# Patient Record
Sex: Female | Born: 2000 | Race: White | Hispanic: No | Marital: Single | Smoking: Never smoker
Health system: Southern US, Community
[De-identification: ages and names within clinical notes are randomized; demographics above are authoritative.]

## PROBLEM LIST (undated history)

## (undated) DIAGNOSIS — R112 Nausea with vomiting, unspecified: Secondary | ICD-10-CM

## (undated) DIAGNOSIS — R040 Epistaxis: Secondary | ICD-10-CM

## (undated) DIAGNOSIS — N920 Excessive and frequent menstruation with regular cycle: Secondary | ICD-10-CM

## (undated) HISTORY — DX: Excessive and frequent menstruation with regular cycle: N92.0

## (undated) HISTORY — DX: Nausea with vomiting, unspecified: R11.2

## (undated) HISTORY — DX: Epistaxis: R04.0

---
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
## 2022-04-01 ENCOUNTER — Ambulatory Visit: Payer: Medicaid Other | Admitting: Adult Health

## 2022-05-16 ENCOUNTER — Encounter: Payer: Self-pay | Admitting: Family Medicine

## 2023-02-05 IMAGING — CT CT ANGIO CHEST
2 of 6 series · 18 of 46 positions shown · IV contrast (Omnipaque or Isovue)
Comparison: None.

CLINICAL DATA: COVID positive with tachycardia.

EXAM:
CT ANGIOGRAPHY CHEST WITH CONTRAST
TECHNIQUE: Multidetector CT imaging of the chest was performed using the
standard protocol during bolus administration of intravenous
contrast. Multiplanar CT image reconstructions and MIPs were
obtained to evaluate the vascular anatomy.
CONTRAST:  25mL OMNIPAQUE IOHEXOL 350 MG/ML SOLN, 25mL OMNIPAQUE
IOHEXOL 350 MG/ML SOLN, 25mL OMNIPAQUE IOHEXOL 350 MG/ML SOLN

[Series 5: pe axial thins · axial · 0.69mm/px · z∈[+994,+1224]mm · 15 of 315 slices shown]
[im 14/315  lung]
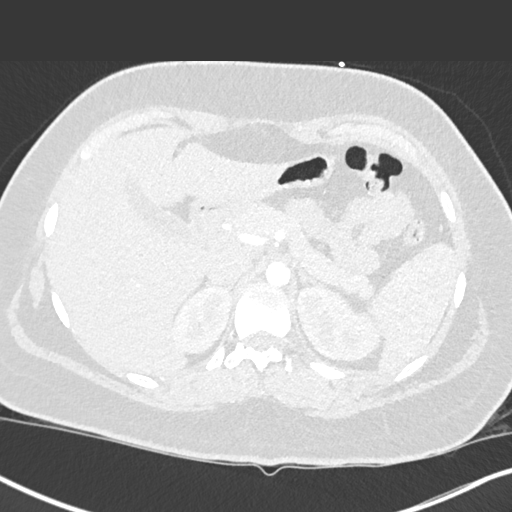
[im 40/315  soft-tissue]
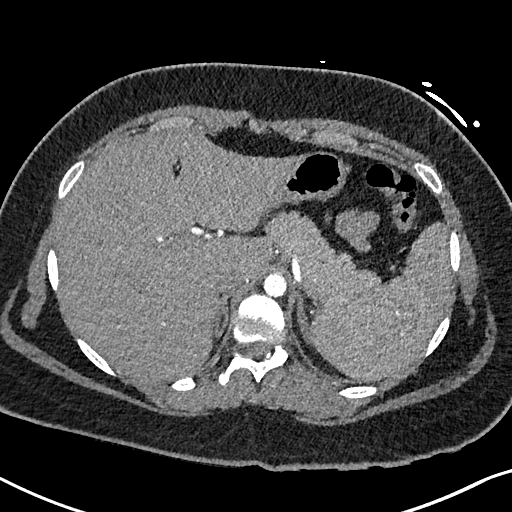
[im 53/315  lung]
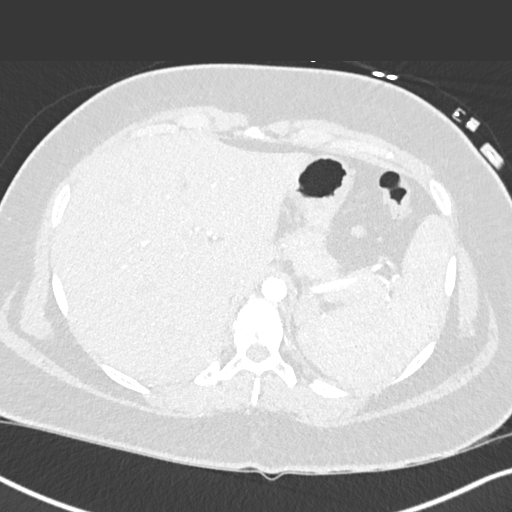
[im 79/315  soft-tissue]
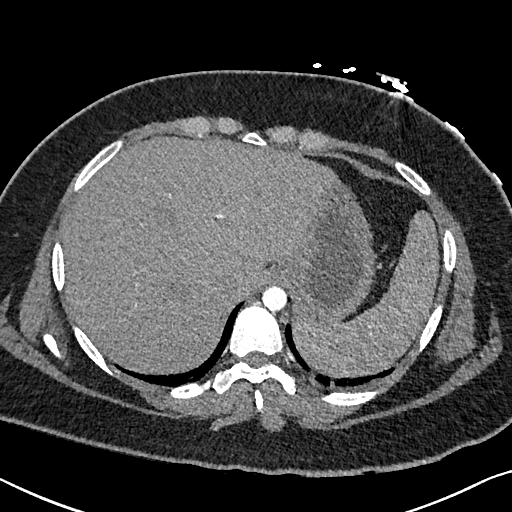
[im 92/315  lung]
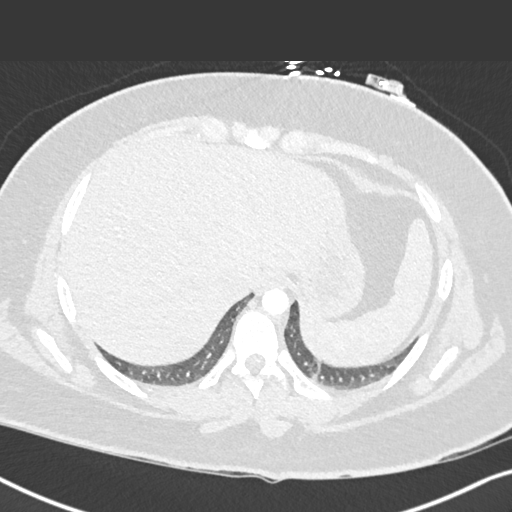
[im 118/315  soft-tissue]
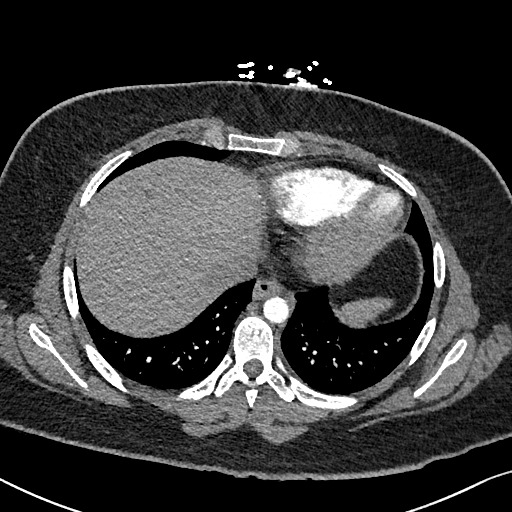
[im 131/315  lung]
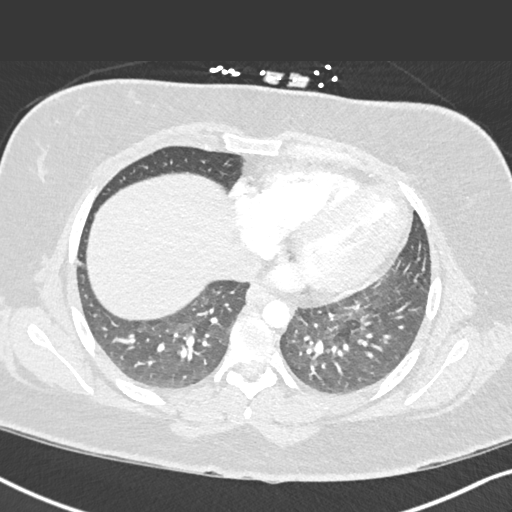
[im 158/315  soft-tissue]
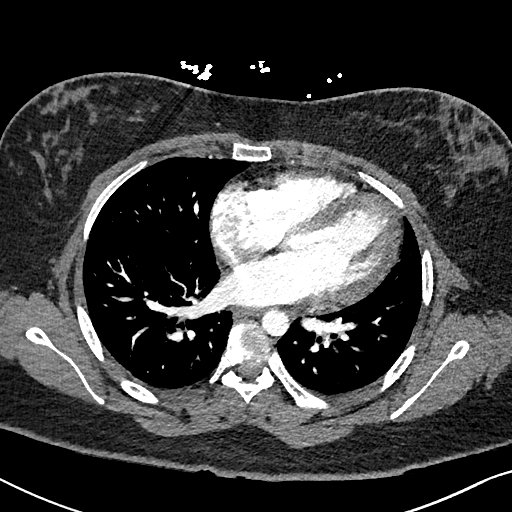
[im 184/315  lung]
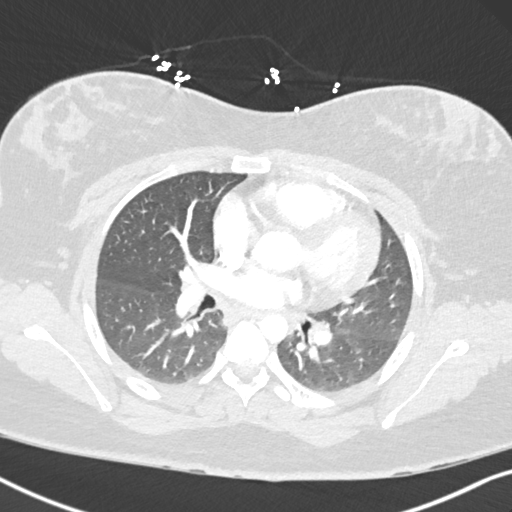
[im 197/315  soft-tissue]
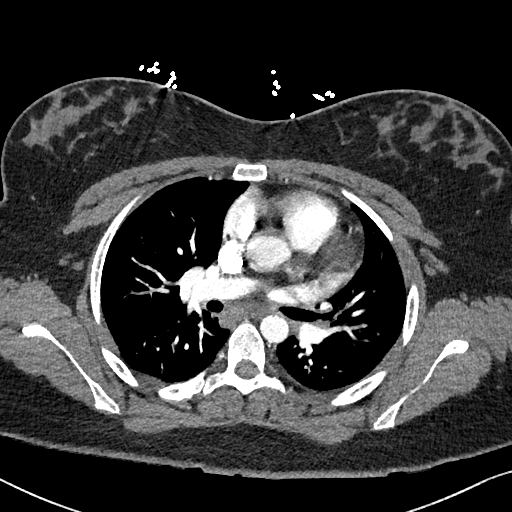
[im 223/315  lung]
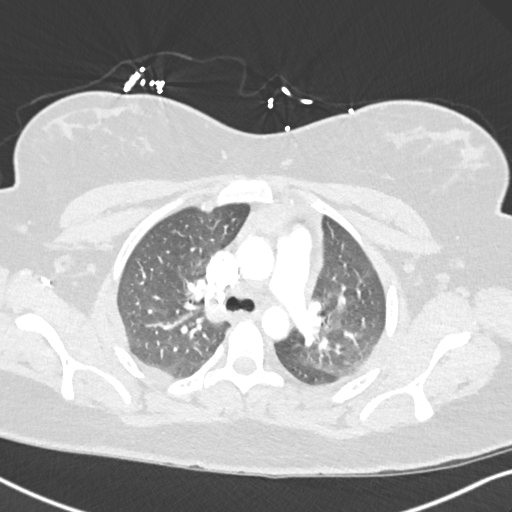
[im 236/315  soft-tissue]
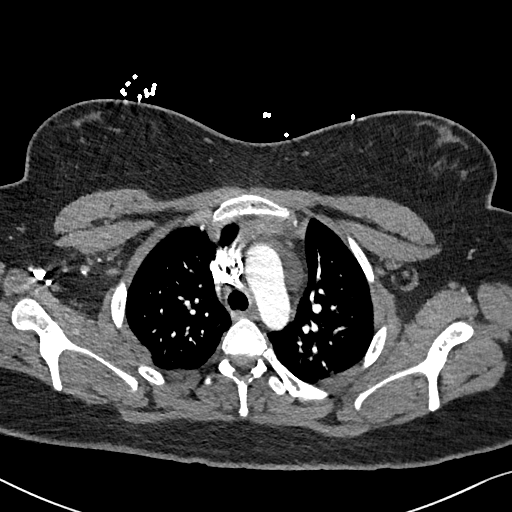
[im 262/315  lung]
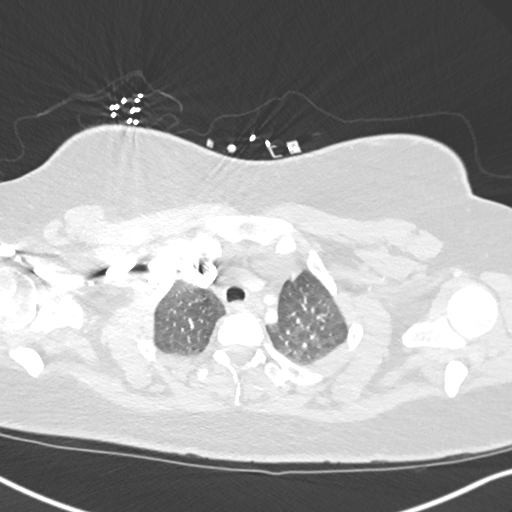
[im 275/315  soft-tissue]
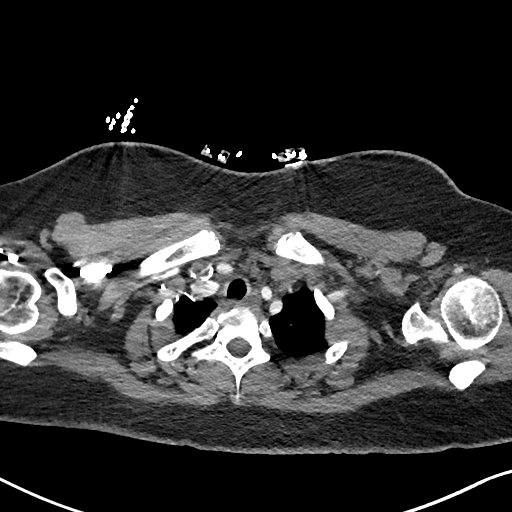
[im 301/315  lung]
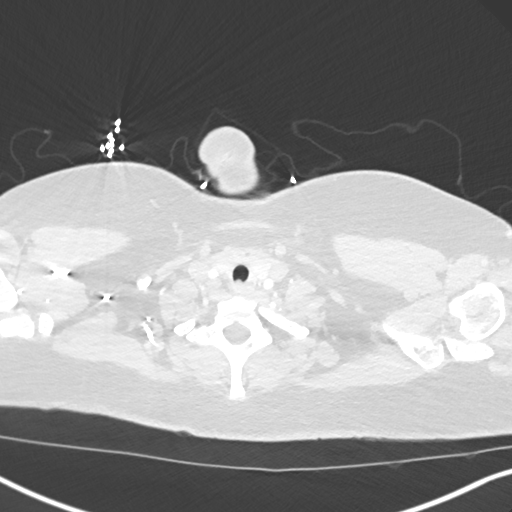

[Series 7: cor soft · coronal · 0.51mm/px · 3 of 154 slices shown]
[im 39/154  soft-tissue]
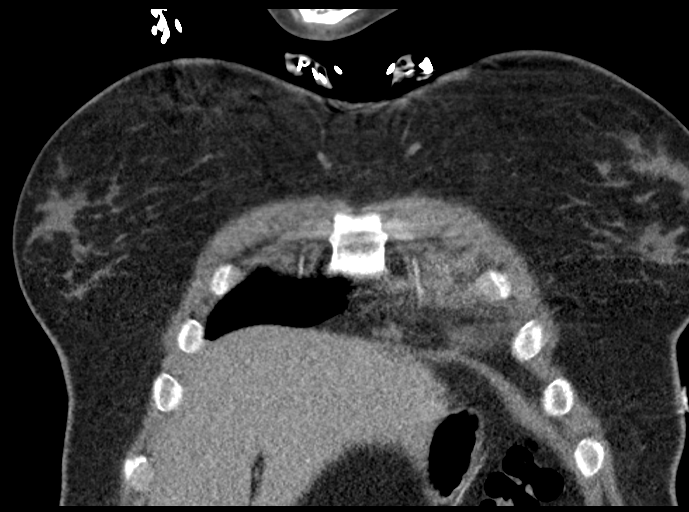
[im 77/154  soft-tissue]
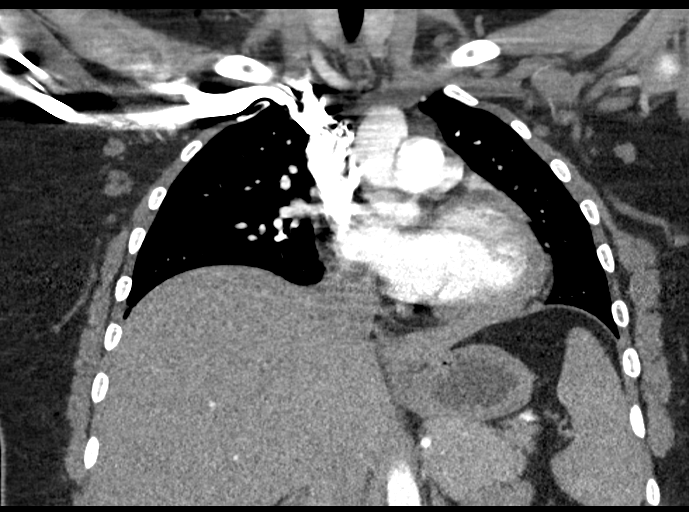
[im 115/154  soft-tissue]
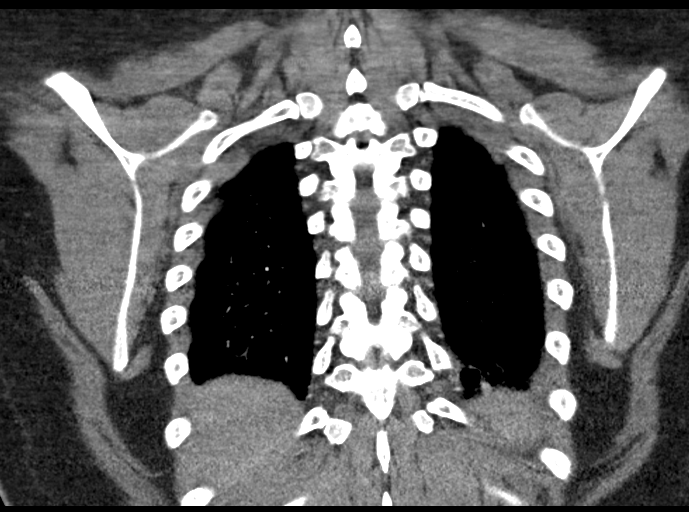

[18 of 46 positions shown; findings below may reference images not displayed]

FINDINGS: Cardiovascular: Satisfactory opacification of the pulmonary arteries
to the segmental level. No evidence of pulmonary embolism. Normal
heart size. No pericardial effusion.

Mediastinum/Nodes: No enlarged mediastinal, hilar, or axillary lymph
nodes. Thyroid gland, trachea, and esophagus demonstrate no
significant findings.

Lungs/Pleura: Lungs are clear. No pleural effusion or pneumothorax.

Upper Abdomen: No acute abnormality.

Musculoskeletal: No chest wall abnormality. No acute or significant
osseous findings.

Review of the MIP images confirms the above findings.
IMPRESSION: No evidence of pulmonary embolism or acute cardiopulmonary disease.

## 2023-04-09 ENCOUNTER — Ambulatory Visit: Payer: Medicaid Other | Admitting: Advanced Practice Midwife

## 2023-05-07 ENCOUNTER — Ambulatory Visit: Payer: Medicaid Other | Admitting: Advanced Practice Midwife

## 2023-05-27 ENCOUNTER — Emergency Department (HOSPITAL_BASED_OUTPATIENT_CLINIC_OR_DEPARTMENT_OTHER): Payer: Medicaid Other

## 2023-05-27 ENCOUNTER — Emergency Department (HOSPITAL_BASED_OUTPATIENT_CLINIC_OR_DEPARTMENT_OTHER)
Admission: EM | Admit: 2023-05-27 | Discharge: 2023-05-28 | Disposition: A | Discharge status: Home / Self Care | Payer: Medicaid Other | Attending: Emergency Medicine | Admitting: Emergency Medicine

## 2023-05-27 ENCOUNTER — Encounter (HOSPITAL_BASED_OUTPATIENT_CLINIC_OR_DEPARTMENT_OTHER): Payer: Self-pay

## 2023-05-27 ENCOUNTER — Other Ambulatory Visit: Payer: Self-pay

## 2023-05-27 DIAGNOSIS — E876 Hypokalemia: Secondary | ICD-10-CM | POA: Diagnosis not present

## 2023-05-27 DIAGNOSIS — R079 Chest pain, unspecified: Secondary | ICD-10-CM | POA: Diagnosis not present

## 2023-05-27 DIAGNOSIS — R7309 Other abnormal glucose: Secondary | ICD-10-CM | POA: Diagnosis not present

## 2023-05-27 LAB — BASIC METABOLIC PANEL
Anion gap: 12 (ref 5–15)
BUN: 10 mg/dL (ref 6–20)
CO2: 22 mmol/L (ref 22–32)
Calcium: 9.7 mg/dL (ref 8.9–10.3)
Chloride: 109 mmol/L (ref 98–111)
Creatinine, Ser: 0.8 mg/dL (ref 0.44–1.00)
GFR, Estimated: >60 mL/min (ref >60)
Glucose, Bld: 118 mg/dL — ABNORMAL HIGH (ref 70–99)
Potassium: 3.4 mmol/L — ABNORMAL LOW (ref 3.5–5.1)
Sodium: 143 mmol/L (ref 135–145)

## 2023-05-27 LAB — TROPONIN I (HIGH SENSITIVITY): Troponin I (High Sensitivity): <2 ng/L (ref <18)

## 2023-05-27 LAB — PREGNANCY, URINE: Preg Test, Ur: NEGATIVE

## 2023-05-27 LAB — CBC
HCT: 41.1 % (ref 36.0–46.0)
Hemoglobin: 14.3 g/dL (ref 12.0–15.0)
MCH: 29.2 pg (ref 26.0–34.0)
MCHC: 34.8 g/dL (ref 30.0–36.0)
MCV: 84 fL (ref 80.0–100.0)
Platelets: 373 10*3/uL (ref 150–400)
RBC: 4.89 MIL/uL (ref 3.87–5.11)
RDW: 12.6 % (ref 11.5–15.5)
WBC: 11.5 10*3/uL — ABNORMAL HIGH (ref 4.0–10.5)
nRBC: 0 % (ref 0.0–0.2)

## 2023-05-27 MED ORDER — POTASSIUM CHLORIDE CRYS ER 20 MEQ PO TBCR
40.0000 meq | EXTENDED_RELEASE_TABLET | Freq: Once | ORAL | Status: AC
  Administered 2023-05-28: 40 meq via ORAL
  Filled 2023-05-27: qty 2

## 2023-05-27 MED ORDER — ONDANSETRON 4 MG PO TBDP
8.0000 mg | ORAL_TABLET | Freq: Once | ORAL | Status: AC
  Administered 2023-05-28: 8 mg via ORAL
  Filled 2023-05-27: qty 2

## 2023-05-27 MED ORDER — ALUM & MAG HYDROXIDE-SIMETH 200-200-20 MG/5ML PO SUSP
30.0000 mL | Freq: Once | ORAL | Status: AC
  Administered 2023-05-28: 30 mL via ORAL
  Filled 2023-05-27: qty 30

## 2023-05-27 NOTE — ED Triage Notes (Signed)
Pt arrived from home via POV c/o sharp chest pain that began @1530  4/10 on pain scale. Pt states for the past 2 days that she has been n/v x 2 days and diarrhea x 7 days 3-4 times per day since.. Pt states that she has been dizzy with positional changes.

## 2023-05-27 NOTE — ED Provider Notes (Signed)
[place] EMERGENCY DEPARTMENT AT [facility] Provider Note   CSN: [id number] Arrival date & time: 05/27/23  1903     History {Add pertinent medical, surgical, social history, OB history to HPI:1} Chief Complaint  Patient presents with   Chest Pain    Pamela Myers is a 22 y.o. female.  The history is provided by the patient.  Chest Pain She has no significant past history and comes in complaining of chest pain since about 3 PM.  She describes a dull, pinching feeling in her midsternal area occasionally radiating to the right parasternal area.  There has been some dyspnea, nausea, vomiting but no diaphoresis.  Symptoms are slightly worse with walking.  Pain has waxed and waned through the day and is actually somewhat less now, but still present.  She has not taken anything for pain.  She is a non-smoker and denies history of hypertension or diabetes or hyperlipidemia.  She does have history of having driven to [place] and back within the last month.  There is a strong family history of premature coronary atherosclerosis.   Home Medications Prior to Admission medications   Medication Sig Start Date End Date Taking? Authorizing Provider  acetaminophen (TYLENOL) 325 MG tablet Take 650 mg by mouth every 6 (six) hours as needed for mild pain, moderate pain or fever.    [provider]  ibuprofen (ADVIL) 200 MG tablet Take 200 mg by mouth every 6 (six) hours as needed for fever, headache or mild pain.    [provider]  Levonorgestrel-Ethinyl Estradiol (AMETHIA) 0.15-0.03 &0.01 MG tablet Take 1 tablet by mouth daily. 12/30/21   Adline Potter, NP  promethazine (PHENERGAN) 25 MG tablet Take 1 tablet (25 mg total) by mouth every 6 (six) hours as needed for nausea or vomiting. 12/30/21   Adline Potter, NP      Allergies    Azithromycin    Review of Systems   Review of Systems  Cardiovascular:  Positive for chest pain.  All other systems reviewed and  are negative.   Physical Exam Updated Vital Signs BP 101/67   Pulse 84   Temp 98.1 F (36.7 C)   Resp 20   Ht 5\' 6"  (1.676 m)   LMP 05/06/2023 (Exact Date)   SpO2 100%   BMI 33.89 kg/m  Physical Exam Vitals and nursing note reviewed.   22 year old female, resting comfortably and in no acute distress. Vital signs are normal. Oxygen saturation is 100%, which is normal. Head is normocephalic and atraumatic. PERRLA, EOMI. Oropharynx is clear. Neck is nontender and supple without adenopathy or JVD. Back is nontender and there is no CVA tenderness. Lungs are clear without rales, wheezes, or rhonchi. Chest is nontender. Heart has regular rate and rhythm without murmur. Abdomen is soft, flat, nontender. Extremities have no cyanosis or edema, full range of motion is present. Skin is warm and dry without rash. Neurologic: Mental status is normal, cranial nerves are intact, moves all extremities equally.  ED Results / Procedures / Treatments   Labs (all labs ordered are listed, but only abnormal results are displayed) Labs Reviewed  BASIC METABOLIC PANEL - Abnormal; Notable for the following components:      Result Value   Potassium 3.4 (*)    Glucose, Bld 118 (*)    All other components within normal limits  CBC - Abnormal; Notable for the following components:   WBC 11.5 (*)    All other components within normal  limits  PREGNANCY, URINE  TROPONIN I (HIGH SENSITIVITY)  TROPONIN I (HIGH SENSITIVITY)    EKG EKG Interpretation Date/Time:  Wednesday May 27 2023 19:13:54 EDT Ventricular Rate:  117 PR Interval:  124 QRS Duration:  86 QT Interval:  362 QTC Calculation: 504 R Axis:   82  Text Interpretation: Sinus tachycardia Otherwise normal ECG When compared with ECG of 01-Mar-2021 09:53, no sig change from previous Reconfirmed by Dione Booze ([id number]) on 05/27/2023 11:17:49 PM  Radiology DG Chest Port 1 View  Result Date: 05/27/2023 CLINICAL DATA:  Chest pain nausea  vomiting EXAM: PORTABLE CHEST 1 VIEW COMPARISON:  03/01/2021 FINDINGS: The heart size and mediastinal contours are within normal limits. Both lungs are clear. The visualized skeletal structures are unremarkable. IMPRESSION: No active disease. Electronically Signed   By: Jasmine Pang M.D.   On: 05/27/2023 19:49    Procedures Procedures  Cardiac monitor shows normal sinus rhythm, per my interpretation.  Medications Ordered in ED Medications - No data to display  ED Course/ Medical Decision Making/ A&P   {   Click here for ABCD2, HEART and other calculatorsREFRESH Note before signing :1}                              Medical Decision Making Amount and/or Complexity of Data Reviewed Labs: ordered. Radiology: ordered.   Chest pain of uncertain cause.  She is very young with minimal risk factors for atherosclerotic disease, doubt ACS.  She does have history of recent travel putting her at risk for pulmonary embolism.  Possible GERD.  Doubt pneumonia, pericarditis, aortic dissection.  I have reviewed and interpreted her electrocardiogram and my interpretation is sinus tachycardia and otherwise normal.  I have reviewed and interpreted her laboratory tests, and my interpretation is mild hypokalemia, elevated random glucose level, mild leukocytosis which is nonspecific.  I have ordered oral potassium.  Glucose will need to be followed as an outpatient.  Initial troponin is normal with repeat pending.  I have added a D-dimer because of the travel history.  I have reviewed her past records, and she has no relevant past visits.  I have also ordered ondansetron for nausea and a dose of Maalox.  {Document critical care time when appropriate:1} {Document review of labs and clinical decision tools ie heart score, Chads2Vasc2 etc:1}  {Document your independent review of radiology images, and any outside records:1} {Document your discussion with family members, caretakers, and with consultants:1} {Document  social determinants of health affecting pt's care:1} {Document your decision making why or why not admission, treatments were needed:1} Final Clinical Impression(s) / ED Diagnoses Final diagnoses:  None    Rx / DC Orders ED Discharge Orders     None

## 2023-05-28 LAB — D-DIMER, QUANTITATIVE: D-Dimer, Quant: <0.27 ug{FEU}/mL (ref 0.00–0.50)

## 2023-05-28 LAB — TROPONIN I (HIGH SENSITIVITY): Troponin I (High Sensitivity): <2 ng/L (ref <18)

## 2023-05-28 MED ORDER — PANTOPRAZOLE SODIUM 40 MG PO TBEC
40.0000 mg | DELAYED_RELEASE_TABLET | Freq: Once | ORAL | Status: AC
  Administered 2023-05-28: 40 mg via ORAL
  Filled 2023-05-28: qty 1

## 2023-05-28 MED ORDER — PANTOPRAZOLE SODIUM 40 MG PO TBEC: 40.0000 mg | DELAYED_RELEASE_TABLET | Freq: Every day | ORAL | 0 refills | Status: AC

## 2023-05-28 NOTE — Discharge Instructions (Signed)
Your evaluation did not show any signs of problems with your heart or lungs.  Pain is likely from acid reflux.  Take antacids as needed.  Return to the emergency department if you have any new or concerning symptoms.

## 2023-05-28 NOTE — ED Notes (Signed)
Reviewed AVS/discharge instruction with patient. Time allotted for and all questions answered. Patient is agreeable for d/c and escorted to ed exit by staff.  

## 2023-06-04 ENCOUNTER — Ambulatory Visit: Payer: Medicaid Other | Admitting: Advanced Practice Midwife

## 2023-07-21 ENCOUNTER — Ambulatory Visit: Payer: Medicaid Other | Attending: Internal Medicine

## 2023-07-21 ENCOUNTER — Telehealth: Payer: Self-pay | Admitting: *Deleted

## 2023-07-21 DIAGNOSIS — G90A Postural orthostatic tachycardia syndrome (POTS): Secondary | ICD-10-CM

## 2023-07-21 NOTE — Telephone Encounter (Signed)
Received a fax from [facility] requesting 7 day holter monitor.

## 2023-07-23 DIAGNOSIS — G90A Postural orthostatic tachycardia syndrome (POTS): Secondary | ICD-10-CM | POA: Diagnosis not present
